# Patient Record
Sex: Female | Born: 1990 | Hispanic: No | Marital: Single | Smoking: Never smoker
Health system: Southern US, Community
[De-identification: ages and names within clinical notes are randomized; demographics above are authoritative.]

## PROBLEM LIST (undated history)

## (undated) DIAGNOSIS — N6452 Nipple discharge: Secondary | ICD-10-CM

## (undated) DIAGNOSIS — D649 Anemia, unspecified: Secondary | ICD-10-CM

## (undated) HISTORY — DX: Nipple discharge: N64.52

## (undated) HISTORY — DX: Anemia, unspecified: D64.9

---
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
## 2010-06-25 ENCOUNTER — Encounter: Payer: Self-pay | Admitting: *Deleted

## 2014-03-10 ENCOUNTER — Encounter: Payer: Self-pay | Admitting: Family Medicine

## 2014-03-10 ENCOUNTER — Ambulatory Visit (INDEPENDENT_AMBULATORY_CARE_PROVIDER_SITE_OTHER): Payer: 59 | Admitting: Family Medicine

## 2014-03-10 VITALS — BP 139/94 | HR 70 | Ht 63.0 in | Wt 146.0 lb

## 2014-03-10 DIAGNOSIS — R5382 Chronic fatigue, unspecified: Secondary | ICD-10-CM | POA: Insufficient documentation

## 2014-03-10 DIAGNOSIS — R3911 Hesitancy of micturition: Secondary | ICD-10-CM

## 2014-03-10 DIAGNOSIS — N6452 Nipple discharge: Secondary | ICD-10-CM

## 2014-03-10 DIAGNOSIS — D649 Anemia, unspecified: Secondary | ICD-10-CM

## 2014-03-10 HISTORY — DX: Nipple discharge: N64.52

## 2014-03-10 HISTORY — DX: Anemia, unspecified: D64.9

## 2014-03-10 LAB — BASIC METABOLIC PANEL WITH GFR
BUN: 11 mg/dL (ref 6–23)
CALCIUM: 9.1 mg/dL (ref 8.4–10.5)
CO2: 26 mEq/L (ref 19–32)
Chloride: 104 mEq/L (ref 96–112)
Creat: 0.78 mg/dL (ref 0.50–1.10)
Glucose, Bld: 90 mg/dL (ref 70–99)
POTASSIUM: 4 meq/L (ref 3.5–5.3)
SODIUM: 138 meq/L (ref 135–145)

## 2014-03-10 LAB — CBC
HCT: 38.8 % (ref 36.0–46.0)
HEMOGLOBIN: 13.1 g/dL (ref 12.0–15.0)
MCH: 30.5 pg (ref 26.0–34.0)
MCHC: 33.8 g/dL (ref 30.0–36.0)
MCV: 90.2 fL (ref 78.0–100.0)
PLATELETS: 347 10*3/uL (ref 150–400)
RBC: 4.3 MIL/uL (ref 3.87–5.11)
RDW: 13.5 % (ref 11.5–15.5)
WBC: 5.2 10*3/uL (ref 4.0–10.5)

## 2014-03-10 LAB — TSH: TSH: 1.42 u[IU]/mL (ref 0.350–4.500)

## 2014-03-10 NOTE — Progress Notes (Addendum)
CC: Jennifer Stone is a 23 y.o. female is here for Establish Care and Fatigue   Subjective: HPI:  Pleasant 23 year old here to establish care  Patient complains of fatigue described as waking well rested in the morning everyday around 8 AM however around 12 noon she gets moderate to severe fatigue to the point were she occasionally has to take a nap. Her sleep habits are on average falling asleep around 1 AM and then awakening and 8. She denies snoring nor any history of witnessed apneic episodes. She denies any history of falling asleep behind the wheel. Symptoms have been present for the past 2 months.  Accompanied by decreased endurance and shortness of breath with her almost daily running routine. Denies any change in medication, diet, nor life circumstances around the time of her fatigue. She reports a history of anemia during her teenage years.  Reports urinary hesitancy described as a pressure sensation in her bladder however when she urinates only a small amount comes out. She believes this is also been going on for 2 months on a daily basis mild to moderate in severity. Nothing particularly makes better or worse.  She reports a history of discharge from the left nipple greater than right nipple that has been present ever since she was around 23 years old and began puberty. Workup has included bilateral mammogram last year which was normal but never blood work. This is currently being managed by her OB/GYN physician. Discharge is described as clear, milky but never with blood.   Review of Systems - General ROS: negative for - chills, fever, night sweats, weight gain or weight loss Ophthalmic ROS: negative for - decreased vision Psychological ROS: negative for - anxiety or depression ENT ROS: negative for - hearing change, nasal congestion, tinnitus or allergies Hematological and Lymphatic ROS: negative for - bleeding problems, bruising or swollen lymph nodes Respiratory ROS: no  cough, shortness of breath with rest, or wheezing Cardiovascular ROS: no chest pain or dyspnea on exertion Gastrointestinal ROS: no abdominal pain, change in bowel habits, or black or bloody stools Genito-Urinary ROS: negative for - genital discharge, genital ulcers, incontinence or abnormal bleeding from genitals Musculoskeletal ROS: negative for - joint pain or muscle pain Neurological ROS: negative for - headaches or memory loss Dermatological ROS: negative for lumps, mole changes, rash and skin lesion changes  Past Medical History  Diagnosis Date  . Absolute anemia 03/10/2014  . Discharge from left nipple 03/10/2014    History reviewed. No pertinent past surgical history. Family History  Problem Relation Age of Onset  . Diabetes Father   . Hypertension      grandmother     History   Social History  . Marital Status: Single    Spouse Name: N/A    Number of Children: N/A  . Years of Education: N/A   Occupational History  . Not on file.   Social History Main Topics  . Smoking status: Never Smoker   . Smokeless tobacco: Not on file  . Alcohol Use: 0.5 oz/week    1 drink(s) per week  . Drug Use: No  . Sexual Activity: Yes    Partners: Male   Other Topics Concern  . Not on file   Social History Narrative  . No narrative on file     Objective: BP 139/94  Pulse 70  Ht 5\' 3"  (1.6 m)  Wt 146 lb (66.225 kg)  BMI 25.87 kg/m2  LMP 03/06/2014  General: Alert and Oriented, No  Acute Distress HEENT: Pupils equal, round, reactive to light. Conjunctivae clear.  External ears unremarkable, canals clear with intact TMs with appropriate landmarks.  Middle ear appears open without effusion. Pink inferior turbinates.  Moist mucous membranes, pharynx without inflammation nor lesions.  Neck supple without palpable lymphadenopathy nor abnormal masses. Lungs: Clear to auscultation bilaterally, no wheezing/ronchi/rales.  Comfortable work of breathing. Good air movement. Cardiac:  Regular rate and rhythm. Normal S1/S2.  No murmurs, rubs, nor gallops.  No carotid bruits Extremities: No peripheral edema.  Strong peripheral pulses.  Mental Status: No depression, anxiety, nor agitation. Skin: Warm and dry.  Assessment & Plan: Jennifer Stone was seen today for establish care and fatigue.  Diagnoses and associated orders for this visit:  Chronic fatigue - TSH - CBC - BASIC METABOLIC PANEL WITH GFR  Urinary hesitancy - Urinalysis, Routine w reflex microscopic  Anemia, unspecified anemia type  Discharge from left nipple - Prolactin - TSH    Chronic fatigue: Rule out hyperthyroidism or anemia. Check a metabolic panel to rule out hypoglycemia or hyperglycemia. Urinary hesitancy: Check a urinalysis with microscopy Left nipple discharge: She's confident she's never had thyroid function or prolactin checked, we will obtain this today for further evaluation of her discharge.   Return if symptoms worsen or fail to improve.

## 2014-03-11 LAB — URINALYSIS, MICROSCOPIC ONLY
Bacteria, UA: NONE SEEN
CASTS: NONE SEEN
CRYSTALS: NONE SEEN
Squamous Epithelial / LPF: NONE SEEN

## 2014-03-11 LAB — URINALYSIS, ROUTINE W REFLEX MICROSCOPIC
BILIRUBIN URINE: NEGATIVE
Glucose, UA: NEGATIVE mg/dL
Hgb urine dipstick: NEGATIVE
Ketones, ur: NEGATIVE mg/dL
NITRITE: NEGATIVE
PROTEIN: NEGATIVE mg/dL
SPECIFIC GRAVITY, URINE: 1.022 (ref 1.005–1.030)
UROBILINOGEN UA: 0.2 mg/dL (ref 0.0–1.0)
pH: 5 (ref 5.0–8.0)

## 2014-03-11 LAB — PROLACTIN: PROLACTIN: 14.8 ng/mL

## 2014-06-19 ENCOUNTER — Ambulatory Visit: Payer: Self-pay | Admitting: Family Medicine

## 2014-06-19 DIAGNOSIS — Z0289 Encounter for other administrative examinations: Secondary | ICD-10-CM
